# Patient Record
Sex: Male | Born: 1950 | Race: White | Hispanic: No | Marital: Married | Smoking: Never smoker
Health system: Southern US, Community
[De-identification: ages and names within clinical notes are randomized; demographics above are authoritative.]

## PROBLEM LIST (undated history)

## (undated) DIAGNOSIS — Z5189 Encounter for other specified aftercare: Secondary | ICD-10-CM

## (undated) DIAGNOSIS — I10 Essential (primary) hypertension: Secondary | ICD-10-CM

## (undated) DIAGNOSIS — C9001 Multiple myeloma in remission: Principal | ICD-10-CM

## (undated) HISTORY — PX: MANDIBLE SURGERY: SHX707

## (undated) HISTORY — PX: COLONOSCOPY: SHX174

## (undated) HISTORY — DX: Multiple myeloma in remission: C90.01

## (undated) HISTORY — PX: OTHER SURGICAL HISTORY: SHX169

## (undated) HISTORY — PX: BACK SURGERY: SHX140

## (undated) HISTORY — DX: Essential (primary) hypertension: I10

## (undated) HISTORY — DX: Encounter for other specified aftercare: Z51.89

---

## 1998-05-30 ENCOUNTER — Ambulatory Visit (HOSPITAL_COMMUNITY): Admission: RE | Admit: 1998-05-30 | Discharge: 1998-05-30 | Payer: Self-pay | Admitting: Hematology and Oncology

## 1998-07-10 ENCOUNTER — Ambulatory Visit (HOSPITAL_COMMUNITY): Admission: RE | Admit: 1998-07-10 | Discharge: 1998-07-10 | Payer: Self-pay | Admitting: Oncology

## 1998-07-13 ENCOUNTER — Ambulatory Visit (HOSPITAL_COMMUNITY): Admission: RE | Admit: 1998-07-13 | Discharge: 1998-07-13 | Payer: Self-pay | Admitting: Oncology

## 1999-01-24 ENCOUNTER — Ambulatory Visit (HOSPITAL_COMMUNITY): Admission: RE | Admit: 1999-01-24 | Discharge: 1999-01-24 | Payer: Self-pay | Admitting: Oncology

## 1999-01-24 ENCOUNTER — Encounter: Payer: Self-pay | Admitting: Oncology

## 1999-04-30 ENCOUNTER — Encounter: Payer: Self-pay | Admitting: Neurosurgery

## 1999-04-30 ENCOUNTER — Ambulatory Visit (HOSPITAL_COMMUNITY): Admission: RE | Admit: 1999-04-30 | Discharge: 1999-04-30 | Payer: Self-pay | Admitting: Neurosurgery

## 2000-01-28 ENCOUNTER — Encounter: Admission: RE | Admit: 2000-01-28 | Discharge: 2000-01-28 | Payer: Self-pay | Admitting: Oncology

## 2000-01-28 ENCOUNTER — Encounter: Payer: Self-pay | Admitting: Oncology

## 2000-05-01 ENCOUNTER — Ambulatory Visit (HOSPITAL_COMMUNITY): Admission: RE | Admit: 2000-05-01 | Discharge: 2000-05-01 | Payer: Self-pay | Admitting: Neurosurgery

## 2000-05-01 ENCOUNTER — Encounter: Payer: Self-pay | Admitting: Neurosurgery

## 2000-07-21 ENCOUNTER — Ambulatory Visit (HOSPITAL_COMMUNITY): Admission: RE | Admit: 2000-07-21 | Discharge: 2000-07-21 | Payer: Self-pay | Admitting: Oncology

## 2000-07-21 ENCOUNTER — Encounter: Payer: Self-pay | Admitting: Oncology

## 2001-01-22 ENCOUNTER — Ambulatory Visit (HOSPITAL_COMMUNITY): Admission: RE | Admit: 2001-01-22 | Discharge: 2001-01-22 | Payer: Self-pay | Admitting: Oncology

## 2001-01-22 ENCOUNTER — Encounter: Payer: Self-pay | Admitting: Oncology

## 2001-07-28 ENCOUNTER — Ambulatory Visit (HOSPITAL_COMMUNITY): Admission: RE | Admit: 2001-07-28 | Discharge: 2001-07-28 | Payer: Self-pay | Admitting: Oncology

## 2001-07-28 ENCOUNTER — Encounter: Payer: Self-pay | Admitting: Oncology

## 2002-02-02 ENCOUNTER — Ambulatory Visit (HOSPITAL_COMMUNITY): Admission: RE | Admit: 2002-02-02 | Discharge: 2002-02-02 | Payer: Self-pay | Admitting: Oncology

## 2002-02-02 ENCOUNTER — Encounter: Payer: Self-pay | Admitting: Oncology

## 2002-08-02 ENCOUNTER — Ambulatory Visit (HOSPITAL_COMMUNITY): Admission: RE | Admit: 2002-08-02 | Discharge: 2002-08-02 | Payer: Self-pay | Admitting: Oncology

## 2002-08-02 ENCOUNTER — Encounter: Payer: Self-pay | Admitting: Oncology

## 2002-09-07 ENCOUNTER — Ambulatory Visit (HOSPITAL_COMMUNITY): Admission: RE | Admit: 2002-09-07 | Discharge: 2002-09-07 | Payer: Self-pay | Admitting: Oncology

## 2002-09-07 ENCOUNTER — Encounter: Payer: Self-pay | Admitting: Oncology

## 2002-10-26 ENCOUNTER — Encounter: Payer: Self-pay | Admitting: Neurosurgery

## 2002-10-26 ENCOUNTER — Ambulatory Visit: Admission: RE | Admit: 2002-10-26 | Discharge: 2002-10-26 | Payer: Self-pay | Admitting: Neurosurgery

## 2003-01-21 ENCOUNTER — Ambulatory Visit (HOSPITAL_COMMUNITY): Admission: RE | Admit: 2003-01-21 | Discharge: 2003-01-21 | Payer: Self-pay | Admitting: Oncology

## 2003-01-21 ENCOUNTER — Encounter: Payer: Self-pay | Admitting: Oncology

## 2003-07-12 ENCOUNTER — Encounter: Payer: Self-pay | Admitting: Oncology

## 2003-07-12 ENCOUNTER — Ambulatory Visit (HOSPITAL_COMMUNITY): Admission: RE | Admit: 2003-07-12 | Discharge: 2003-07-12 | Payer: Self-pay | Admitting: Oncology

## 2004-01-02 ENCOUNTER — Ambulatory Visit (HOSPITAL_COMMUNITY): Admission: RE | Admit: 2004-01-02 | Discharge: 2004-01-02 | Payer: Self-pay | Admitting: Oncology

## 2004-07-23 ENCOUNTER — Ambulatory Visit (HOSPITAL_COMMUNITY): Admission: RE | Admit: 2004-07-23 | Discharge: 2004-07-23 | Payer: Self-pay | Admitting: Oncology

## 2004-10-15 ENCOUNTER — Ambulatory Visit: Payer: Self-pay | Admitting: Oncology

## 2005-01-28 ENCOUNTER — Ambulatory Visit: Payer: Self-pay | Admitting: Oncology

## 2005-01-30 ENCOUNTER — Ambulatory Visit (HOSPITAL_COMMUNITY): Admission: RE | Admit: 2005-01-30 | Discharge: 2005-01-30 | Payer: Self-pay | Admitting: Oncology

## 2005-04-19 ENCOUNTER — Ambulatory Visit: Payer: Self-pay | Admitting: Oncology

## 2005-07-19 ENCOUNTER — Ambulatory Visit: Payer: Self-pay | Admitting: Oncology

## 2005-10-25 ENCOUNTER — Ambulatory Visit: Payer: Self-pay | Admitting: Oncology

## 2006-01-17 ENCOUNTER — Ambulatory Visit: Payer: Self-pay | Admitting: Oncology

## 2006-01-22 ENCOUNTER — Ambulatory Visit (HOSPITAL_COMMUNITY): Admission: RE | Admit: 2006-01-22 | Discharge: 2006-01-22 | Payer: Self-pay | Admitting: Oncology

## 2006-04-24 ENCOUNTER — Ambulatory Visit: Payer: Self-pay | Admitting: Oncology

## 2006-04-28 LAB — CBC WITH DIFFERENTIAL/PLATELET
Basophils Absolute: 0 10*3/uL (ref 0.0–0.1)
EOS%: 1.5 % (ref 0.0–7.0)
Eosinophils Absolute: 0.1 10*3/uL (ref 0.0–0.5)
HGB: 13.8 g/dL (ref 13.0–17.1)
NEUT#: 4.8 10*3/uL (ref 1.5–6.5)
RDW: 13.3 % (ref 11.2–14.6)
lymph#: 1.7 10*3/uL (ref 0.9–3.3)

## 2006-04-28 LAB — MORPHOLOGY: PLT EST: ADEQUATE

## 2006-04-30 LAB — COMPREHENSIVE METABOLIC PANEL
AST: 33 U/L (ref 0–37)
Albumin: 4.4 g/dL (ref 3.5–5.2)
Alkaline Phosphatase: 57 U/L (ref 39–117)
BUN: 13 mg/dL (ref 6–23)
Potassium: 3.9 mEq/L (ref 3.5–5.3)
Sodium: 132 mEq/L — ABNORMAL LOW (ref 135–145)

## 2006-04-30 LAB — KAPPA/LAMBDA LIGHT CHAINS
Kappa free light chain: 1.43 mg/dL (ref 0.33–1.94)
Kappa:Lambda Ratio: 1.91 — ABNORMAL HIGH (ref 0.26–1.65)

## 2006-07-17 ENCOUNTER — Ambulatory Visit: Payer: Self-pay | Admitting: Oncology

## 2006-07-21 ENCOUNTER — Ambulatory Visit (HOSPITAL_COMMUNITY): Admission: RE | Admit: 2006-07-21 | Discharge: 2006-07-21 | Payer: Self-pay | Admitting: Oncology

## 2006-07-21 LAB — CBC WITH DIFFERENTIAL/PLATELET
BASO%: 0.1 % (ref 0.0–2.0)
Basophils Absolute: 0 10*3/uL (ref 0.0–0.1)
EOS%: 1 % (ref 0.0–7.0)
HCT: 39.1 % (ref 38.7–49.9)
HGB: 13.9 g/dL (ref 13.0–17.1)
LYMPH%: 21.1 % (ref 14.0–48.0)
MONO#: 0.4 10*3/uL (ref 0.1–0.9)
MONO%: 5.6 % (ref 0.0–13.0)
NEUT#: 5 10*3/uL (ref 1.5–6.5)
NEUT%: 72.2 % (ref 40.0–75.0)
WBC: 6.9 10*3/uL (ref 4.0–10.0)
lymph#: 1.5 10*3/uL (ref 0.9–3.3)

## 2006-07-24 LAB — IMMUNOFIXATION ELECTROPHORESIS: Total Protein, Serum Electrophoresis: 6.5 g/dL (ref 6.0–8.3)

## 2006-07-24 LAB — COMPREHENSIVE METABOLIC PANEL
Albumin: 4.5 g/dL (ref 3.5–5.2)
CO2: 27 mEq/L (ref 19–32)
Glucose, Bld: 106 mg/dL — ABNORMAL HIGH (ref 70–99)
Potassium: 3.9 mEq/L (ref 3.5–5.3)
Sodium: 136 mEq/L (ref 135–145)
Total Bilirubin: 0.8 mg/dL (ref 0.3–1.2)
Total Protein: 6.5 g/dL (ref 6.0–8.3)

## 2006-07-24 LAB — LACTATE DEHYDROGENASE: LDH: 200 U/L (ref 94–250)

## 2006-07-24 LAB — KAPPA/LAMBDA LIGHT CHAINS: Kappa:Lambda Ratio: 1.99 — ABNORMAL HIGH (ref 0.26–1.65)

## 2006-07-24 LAB — BETA 2 MICROGLOBULIN, SERUM: Beta-2 Microglobulin: 1.16 mg/L (ref 1.01–1.73)

## 2007-01-14 ENCOUNTER — Ambulatory Visit: Payer: Self-pay | Admitting: Oncology

## 2007-01-19 LAB — MORPHOLOGY
PLT EST: ADEQUATE
RBC Comments: NORMAL

## 2007-01-19 LAB — CBC WITH DIFFERENTIAL/PLATELET
BASO%: 0.3 % (ref 0.0–2.0)
MCHC: 36 g/dL — ABNORMAL HIGH (ref 32.0–35.9)
MONO#: 0.5 10*3/uL (ref 0.1–0.9)
RBC: 4.52 10*6/uL (ref 4.20–5.71)
WBC: 6.7 10*3/uL (ref 4.0–10.0)
lymph#: 1.5 10*3/uL (ref 0.9–3.3)

## 2007-01-21 LAB — COMPREHENSIVE METABOLIC PANEL
ALT: 27 U/L (ref 0–53)
Alkaline Phosphatase: 58 U/L (ref 39–117)
Sodium: 139 mEq/L (ref 135–145)
Total Bilirubin: 0.6 mg/dL (ref 0.3–1.2)
Total Protein: 6.9 g/dL (ref 6.0–8.3)

## 2007-01-21 LAB — IMMUNOFIXATION ELECTROPHORESIS
IgA: 196 mg/dL (ref 68–378)
IgG (Immunoglobin G), Serum: 1030 mg/dL (ref 694–1618)
IgM, Serum: 87 mg/dL (ref 60–263)
Total Protein, Serum Electrophoresis: 6.9 g/dL (ref 6.0–8.3)

## 2007-01-21 LAB — KAPPA/LAMBDA LIGHT CHAINS: Lambda Free Lght Chn: 0.76 mg/dL (ref 0.57–2.63)

## 2007-07-23 ENCOUNTER — Ambulatory Visit: Payer: Self-pay | Admitting: Oncology

## 2007-07-27 ENCOUNTER — Ambulatory Visit (HOSPITAL_COMMUNITY): Admission: RE | Admit: 2007-07-27 | Discharge: 2007-07-27 | Payer: Self-pay | Admitting: Oncology

## 2007-07-27 LAB — CBC WITH DIFFERENTIAL/PLATELET
Basophils Absolute: 0 10*3/uL (ref 0.0–0.1)
EOS%: 1.3 % (ref 0.0–7.0)
Eosinophils Absolute: 0.1 10*3/uL (ref 0.0–0.5)
HCT: 38.3 % — ABNORMAL LOW (ref 38.7–49.9)
HGB: 13.7 g/dL (ref 13.0–17.1)
LYMPH%: 28 % (ref 14.0–48.0)
MCH: 32 pg (ref 28.0–33.4)
MCV: 89.5 fL (ref 81.6–98.0)
MONO%: 4.6 % (ref 0.0–13.0)
NEUT#: 3.9 10*3/uL (ref 1.5–6.5)
NEUT%: 65.7 % (ref 40.0–75.0)
Platelets: 168 10*3/uL (ref 145–400)

## 2007-07-29 LAB — COMPREHENSIVE METABOLIC PANEL
AST: 27 U/L (ref 0–37)
Albumin: 4.4 g/dL (ref 3.5–5.2)
Alkaline Phosphatase: 54 U/L (ref 39–117)
BUN: 12 mg/dL (ref 6–23)
Creatinine, Ser: 0.91 mg/dL (ref 0.40–1.50)
Glucose, Bld: 135 mg/dL — ABNORMAL HIGH (ref 70–99)

## 2007-07-29 LAB — IMMUNOFIXATION ELECTROPHORESIS
IgG (Immunoglobin G), Serum: 964 mg/dL (ref 694–1618)
IgM, Serum: 86 mg/dL (ref 60–263)
Total Protein, Serum Electrophoresis: 6.8 g/dL (ref 6.0–8.3)

## 2007-07-29 LAB — KAPPA/LAMBDA LIGHT CHAINS
Kappa:Lambda Ratio: 0.88 (ref 0.26–1.65)
Lambda Free Lght Chn: 0.94 mg/dL (ref 0.57–2.63)

## 2007-09-09 ENCOUNTER — Ambulatory Visit: Payer: Self-pay | Admitting: Gastroenterology

## 2007-10-09 ENCOUNTER — Ambulatory Visit: Payer: Self-pay | Admitting: Gastroenterology

## 2007-10-09 ENCOUNTER — Encounter: Payer: Self-pay | Admitting: Gastroenterology

## 2007-10-23 ENCOUNTER — Ambulatory Visit: Payer: Self-pay | Admitting: Oncology

## 2007-10-27 LAB — CBC WITH DIFFERENTIAL/PLATELET
Basophils Absolute: 0 10*3/uL (ref 0.0–0.1)
Eosinophils Absolute: 0.1 10*3/uL (ref 0.0–0.5)
HCT: 37.9 % — ABNORMAL LOW (ref 38.7–49.9)
HGB: 13.7 g/dL (ref 13.0–17.1)
MCH: 32.4 pg (ref 28.0–33.4)
NEUT#: 3.6 10*3/uL (ref 1.5–6.5)
NEUT%: 62.3 % (ref 40.0–75.0)
RDW: 13.1 % (ref 11.2–14.6)
lymph#: 1.6 10*3/uL (ref 0.9–3.3)

## 2007-10-27 LAB — MORPHOLOGY: PLT EST: ADEQUATE

## 2007-10-29 LAB — IMMUNOFIXATION ELECTROPHORESIS
IgG (Immunoglobin G), Serum: 1040 mg/dL (ref 694–1618)
IgM, Serum: 99 mg/dL (ref 60–263)
Total Protein, Serum Electrophoresis: 6.6 g/dL (ref 6.0–8.3)

## 2007-10-29 LAB — COMPREHENSIVE METABOLIC PANEL
BUN: 14 mg/dL (ref 6–23)
CO2: 27 mEq/L (ref 19–32)
Glucose, Bld: 78 mg/dL (ref 70–99)
Sodium: 137 mEq/L (ref 135–145)
Total Bilirubin: 0.8 mg/dL (ref 0.3–1.2)
Total Protein: 6.6 g/dL (ref 6.0–8.3)

## 2007-10-29 LAB — LACTATE DEHYDROGENASE: LDH: 177 U/L (ref 94–250)

## 2007-10-29 LAB — KAPPA/LAMBDA LIGHT CHAINS
Kappa:Lambda Ratio: 0.74 (ref 0.26–1.65)
Lambda Free Lght Chn: 1.12 mg/dL (ref 0.57–2.63)

## 2008-01-14 ENCOUNTER — Ambulatory Visit: Payer: Self-pay | Admitting: Oncology

## 2008-01-18 LAB — CBC WITH DIFFERENTIAL/PLATELET
BASO%: 0.5 % (ref 0.0–2.0)
EOS%: 2.6 % (ref 0.0–7.0)
HCT: 37.8 % — ABNORMAL LOW (ref 38.7–49.9)
LYMPH%: 30.4 % (ref 14.0–48.0)
MCH: 31.6 pg (ref 28.0–33.4)
MCHC: 35.4 g/dL (ref 32.0–35.9)
NEUT%: 60.1 % (ref 40.0–75.0)
Platelets: 160 10*3/uL (ref 145–400)
RBC: 4.23 10*6/uL (ref 4.20–5.71)
lymph#: 1.6 10*3/uL (ref 0.9–3.3)

## 2008-01-18 LAB — MORPHOLOGY: RBC Comments: NORMAL

## 2008-01-20 LAB — COMPREHENSIVE METABOLIC PANEL
Albumin: 4.4 g/dL (ref 3.5–5.2)
Alkaline Phosphatase: 52 U/L (ref 39–117)
BUN: 16 mg/dL (ref 6–23)
Calcium: 9 mg/dL (ref 8.4–10.5)
Chloride: 101 mEq/L (ref 96–112)
Glucose, Bld: 83 mg/dL (ref 70–99)
Potassium: 4.1 mEq/L (ref 3.5–5.3)

## 2008-01-20 LAB — IMMUNOFIXATION ELECTROPHORESIS
IgG (Immunoglobin G), Serum: 937 mg/dL (ref 694–1618)
Total Protein, Serum Electrophoresis: 6.6 g/dL (ref 6.0–8.3)

## 2008-01-20 LAB — KAPPA/LAMBDA LIGHT CHAINS: Kappa:Lambda Ratio: 2.24 — ABNORMAL HIGH (ref 0.26–1.65)

## 2008-06-29 ENCOUNTER — Ambulatory Visit: Payer: Self-pay | Admitting: Oncology

## 2008-07-04 ENCOUNTER — Ambulatory Visit (HOSPITAL_COMMUNITY): Admission: RE | Admit: 2008-07-04 | Discharge: 2008-07-04 | Payer: Self-pay | Admitting: Oncology

## 2008-07-04 LAB — CBC WITH DIFFERENTIAL/PLATELET
BASO%: 0.7 % (ref 0.0–2.0)
Basophils Absolute: 0.1 10*3/uL (ref 0.0–0.1)
Eosinophils Absolute: 0.2 10*3/uL (ref 0.0–0.5)
HCT: 39.3 % (ref 38.7–49.9)
HGB: 14.4 g/dL (ref 13.0–17.1)
LYMPH%: 26.7 % (ref 14.0–48.0)
MCHC: 36.6 g/dL — ABNORMAL HIGH (ref 32.0–35.9)
MONO#: 0.4 10*3/uL (ref 0.1–0.9)
NEUT%: 63.6 % (ref 40.0–75.0)
Platelets: 175 10*3/uL (ref 145–400)
WBC: 7.4 10*3/uL (ref 4.0–10.0)
lymph#: 2 10*3/uL (ref 0.9–3.3)

## 2008-07-04 LAB — MORPHOLOGY: PLT EST: ADEQUATE

## 2008-07-06 LAB — KAPPA/LAMBDA LIGHT CHAINS
Kappa free light chain: 1.06 mg/dL (ref 0.33–1.94)
Lambda Free Lght Chn: 1.25 mg/dL (ref 0.57–2.63)

## 2008-07-06 LAB — COMPREHENSIVE METABOLIC PANEL
ALT: 37 U/L (ref 0–53)
Albumin: 4.5 g/dL (ref 3.5–5.2)
CO2: 26 mEq/L (ref 19–32)
Chloride: 99 mEq/L (ref 96–112)
Glucose, Bld: 95 mg/dL (ref 70–99)
Potassium: 4.2 mEq/L (ref 3.5–5.3)
Sodium: 134 mEq/L — ABNORMAL LOW (ref 135–145)
Total Protein: 6.9 g/dL (ref 6.0–8.3)

## 2008-07-06 LAB — IMMUNOFIXATION ELECTROPHORESIS: IgG (Immunoglobin G), Serum: 1050 mg/dL (ref 694–1618)

## 2008-07-06 LAB — LACTATE DEHYDROGENASE: LDH: 172 U/L (ref 94–250)

## 2008-07-06 LAB — IGE: IgE (Immunoglobulin E), Serum: 248.4 IU/mL — ABNORMAL HIGH (ref 0.0–180.0)

## 2009-01-11 ENCOUNTER — Ambulatory Visit: Payer: Self-pay | Admitting: Oncology

## 2009-01-16 LAB — CBC WITH DIFFERENTIAL/PLATELET
BASO%: 0.3 % (ref 0.0–2.0)
EOS%: 2.6 % (ref 0.0–7.0)
HGB: 14.2 g/dL (ref 13.0–17.1)
MCH: 31.9 pg (ref 28.0–33.4)
MCHC: 35.4 g/dL (ref 32.0–35.9)
RBC: 4.44 10*6/uL (ref 4.20–5.71)
RDW: 12.8 % (ref 11.2–14.6)
lymph#: 1.7 10*3/uL (ref 0.9–3.3)

## 2009-01-16 LAB — MORPHOLOGY: PLT EST: ADEQUATE

## 2009-01-17 LAB — COMPREHENSIVE METABOLIC PANEL
ALT: 28 U/L (ref 0–53)
AST: 26 U/L (ref 0–37)
Creatinine, Ser: 1 mg/dL (ref 0.40–1.50)
Total Bilirubin: 0.5 mg/dL (ref 0.3–1.2)

## 2009-01-17 LAB — LACTATE DEHYDROGENASE: LDH: 150 U/L (ref 94–250)

## 2009-07-12 ENCOUNTER — Ambulatory Visit: Payer: Self-pay | Admitting: Oncology

## 2009-07-14 LAB — COMPREHENSIVE METABOLIC PANEL
ALT: 26 U/L (ref 0–53)
AST: 25 U/L (ref 0–37)
Alkaline Phosphatase: 54 U/L (ref 39–117)
Creatinine, Ser: 0.91 mg/dL (ref 0.40–1.50)
Total Bilirubin: 0.8 mg/dL (ref 0.3–1.2)

## 2009-07-14 LAB — CBC WITH DIFFERENTIAL/PLATELET
BASO%: 0.3 % (ref 0.0–2.0)
EOS%: 1.6 % (ref 0.0–7.0)
MCH: 31.8 pg (ref 27.2–33.4)
MCHC: 34.7 g/dL (ref 32.0–36.0)
RDW: 13.1 % (ref 11.0–14.6)
WBC: 5.4 10*3/uL (ref 4.0–10.3)
lymph#: 1.5 10*3/uL (ref 0.9–3.3)

## 2009-07-14 LAB — KAPPA/LAMBDA LIGHT CHAINS
Kappa free light chain: 1.04 mg/dL (ref 0.33–1.94)
Kappa:Lambda Ratio: 1.21 (ref 0.26–1.65)
Lambda Free Lght Chn: 0.86 mg/dL (ref 0.57–2.63)

## 2009-07-14 LAB — LACTATE DEHYDROGENASE: LDH: 168 U/L (ref 94–250)

## 2009-07-14 LAB — MORPHOLOGY
PLT EST: ADEQUATE
RBC Comments: NORMAL

## 2009-07-17 ENCOUNTER — Ambulatory Visit (HOSPITAL_COMMUNITY): Admission: RE | Admit: 2009-07-17 | Discharge: 2009-07-17 | Payer: Self-pay | Admitting: Oncology

## 2010-01-12 ENCOUNTER — Ambulatory Visit: Payer: Self-pay | Admitting: Oncology

## 2010-01-16 LAB — CBC WITH DIFFERENTIAL/PLATELET
BASO%: 0.3 % (ref 0.0–2.0)
HCT: 39.7 % (ref 38.4–49.9)
LYMPH%: 25.2 % (ref 14.0–49.0)
MCHC: 34.9 g/dL (ref 32.0–36.0)
MCV: 91.3 fL (ref 79.3–98.0)
MONO#: 0.4 10*3/uL (ref 0.1–0.9)
NEUT%: 65.6 % (ref 39.0–75.0)
Platelets: 180 10*3/uL (ref 140–400)
WBC: 6.2 10*3/uL (ref 4.0–10.3)

## 2010-01-17 LAB — LACTATE DEHYDROGENASE: LDH: 146 U/L (ref 94–250)

## 2010-01-17 LAB — KAPPA/LAMBDA LIGHT CHAINS
Kappa free light chain: 1.51 mg/dL (ref 0.33–1.94)
Kappa:Lambda Ratio: 1.09 (ref 0.26–1.65)

## 2010-01-17 LAB — COMPREHENSIVE METABOLIC PANEL
ALT: 29 U/L (ref 0–53)
CO2: 25 mEq/L (ref 19–32)
Creatinine, Ser: 0.84 mg/dL (ref 0.40–1.50)
Glucose, Bld: 94 mg/dL (ref 70–99)
Total Bilirubin: 0.6 mg/dL (ref 0.3–1.2)

## 2010-07-30 ENCOUNTER — Ambulatory Visit (HOSPITAL_COMMUNITY): Admission: RE | Admit: 2010-07-30 | Discharge: 2010-07-30 | Payer: Self-pay | Admitting: Oncology

## 2010-07-30 ENCOUNTER — Ambulatory Visit (HOSPITAL_BASED_OUTPATIENT_CLINIC_OR_DEPARTMENT_OTHER): Payer: 59 | Admitting: Oncology

## 2010-07-30 LAB — LACTATE DEHYDROGENASE: LDH: 143 U/L (ref 94–250)

## 2010-07-30 LAB — CBC WITH DIFFERENTIAL/PLATELET
BASO%: 0.5 % (ref 0.0–2.0)
Eosinophils Absolute: 0.1 10*3/uL (ref 0.0–0.5)
MCHC: 34.2 g/dL (ref 32.0–36.0)
MONO#: 0.4 10*3/uL (ref 0.1–0.9)
NEUT#: 4.5 10*3/uL (ref 1.5–6.5)
RBC: 4.44 10*6/uL (ref 4.20–5.82)
WBC: 6.6 10*3/uL (ref 4.0–10.3)
lymph#: 1.5 10*3/uL (ref 0.9–3.3)

## 2010-07-30 LAB — COMPREHENSIVE METABOLIC PANEL
ALT: 34 U/L (ref 0–53)
Albumin: 3.9 g/dL (ref 3.5–5.2)
CO2: 28 mEq/L (ref 19–32)
Calcium: 9.1 mg/dL (ref 8.4–10.5)
Chloride: 100 mEq/L (ref 96–112)
Potassium: 4 mEq/L (ref 3.5–5.3)
Sodium: 135 mEq/L (ref 135–145)
Total Bilirubin: 0.8 mg/dL (ref 0.3–1.2)
Total Protein: 7.1 g/dL (ref 6.0–8.3)

## 2010-12-29 ENCOUNTER — Encounter: Payer: Self-pay | Admitting: Oncology

## 2010-12-30 ENCOUNTER — Encounter: Payer: Self-pay | Admitting: Oncology

## 2010-12-31 ENCOUNTER — Encounter: Payer: Self-pay | Admitting: Oncology

## 2011-01-28 ENCOUNTER — Encounter: Payer: 59 | Admitting: Oncology

## 2011-01-28 DIAGNOSIS — C9001 Multiple myeloma in remission: Secondary | ICD-10-CM

## 2011-01-28 LAB — CBC WITH DIFFERENTIAL/PLATELET
BASO%: 0.6 % (ref 0.0–2.0)
Basophils Absolute: 0 10*3/uL (ref 0.0–0.1)
EOS%: 2.4 % (ref 0.0–7.0)
Eosinophils Absolute: 0.2 10*3/uL (ref 0.0–0.5)
HCT: 39.1 % (ref 38.4–49.9)
HGB: 13.8 g/dL (ref 13.0–17.1)
LYMPH%: 26.6 % (ref 14.0–49.0)
MCH: 31.9 pg (ref 27.2–33.4)
MCHC: 35.2 g/dL (ref 32.0–36.0)
MCV: 90.6 fL (ref 79.3–98.0)
MONO#: 0.4 10*3/uL (ref 0.1–0.9)
MONO%: 6 % (ref 0.0–14.0)
NEUT#: 4.5 10*3/uL (ref 1.5–6.5)
NEUT%: 64.4 % (ref 39.0–75.0)
Platelets: 169 10*3/uL (ref 140–400)
RBC: 4.32 10*6/uL (ref 4.20–5.82)
RDW: 13.1 % (ref 11.0–14.6)
WBC: 6.9 10*3/uL (ref 4.0–10.3)
lymph#: 1.8 10*3/uL (ref 0.9–3.3)

## 2011-01-28 LAB — MORPHOLOGY
PLT EST: ADEQUATE
RBC Comments: NORMAL

## 2011-01-28 LAB — COMPREHENSIVE METABOLIC PANEL
ALT: 38 U/L (ref 0–53)
Alkaline Phosphatase: 51 U/L (ref 39–117)
CO2: 31 mEq/L (ref 19–32)
Creatinine, Ser: 1.02 mg/dL (ref 0.40–1.50)
Glucose, Bld: 79 mg/dL (ref 70–99)
Total Bilirubin: 0.8 mg/dL (ref 0.3–1.2)

## 2011-01-28 LAB — LACTATE DEHYDROGENASE: LDH: 151 U/L (ref 94–250)

## 2011-01-30 LAB — KAPPA/LAMBDA LIGHT CHAINS
Kappa free light chain: 1.12 mg/dL (ref 0.33–1.94)
Kappa:Lambda Ratio: 1.05 (ref 0.26–1.65)

## 2011-01-30 LAB — IMMUNOFIXATION ELECTROPHORESIS: Total Protein, Serum Electrophoresis: 6.8 g/dL (ref 6.0–8.3)

## 2011-07-31 ENCOUNTER — Other Ambulatory Visit: Payer: Self-pay | Admitting: Oncology

## 2011-07-31 ENCOUNTER — Encounter (HOSPITAL_BASED_OUTPATIENT_CLINIC_OR_DEPARTMENT_OTHER): Payer: 59 | Admitting: Oncology

## 2011-07-31 DIAGNOSIS — C9001 Multiple myeloma in remission: Secondary | ICD-10-CM

## 2011-07-31 LAB — CBC WITH DIFFERENTIAL/PLATELET
Basophils Absolute: 0 10*3/uL (ref 0.0–0.1)
EOS%: 1.3 % (ref 0.0–7.0)
HCT: 40.9 % (ref 38.4–49.9)
HGB: 14.4 g/dL (ref 13.0–17.1)
MCH: 32.2 pg (ref 27.2–33.4)
MCV: 91.7 fL (ref 79.3–98.0)
MONO%: 6.1 % (ref 0.0–14.0)
NEUT%: 68.6 % (ref 39.0–75.0)
Platelets: 179 10*3/uL (ref 140–400)

## 2011-08-02 LAB — IMMUNOFIXATION ELECTROPHORESIS: Total Protein, Serum Electrophoresis: 7.1 g/dL (ref 6.0–8.3)

## 2011-08-02 LAB — COMPREHENSIVE METABOLIC PANEL
Alkaline Phosphatase: 54 U/L (ref 39–117)
BUN: 14 mg/dL (ref 6–23)
Glucose, Bld: 89 mg/dL (ref 70–99)
Total Bilirubin: 0.7 mg/dL (ref 0.3–1.2)

## 2011-08-02 LAB — BETA 2 MICROGLOBULIN, SERUM: Beta-2 Microglobulin: 1.28 mg/L (ref 1.01–1.73)

## 2011-08-02 LAB — KAPPA/LAMBDA LIGHT CHAINS
Kappa:Lambda Ratio: 1.3 (ref 0.26–1.65)
Lambda Free Lght Chn: 1.32 mg/dL (ref 0.57–2.63)

## 2011-08-05 ENCOUNTER — Ambulatory Visit (HOSPITAL_COMMUNITY)
Admission: RE | Admit: 2011-08-05 | Discharge: 2011-08-05 | Disposition: A | Discharge status: Home / Self Care | Payer: 59 | Source: Ambulatory Visit | Attending: Oncology | Admitting: Oncology

## 2011-08-05 DIAGNOSIS — C9000 Multiple myeloma not having achieved remission: Secondary | ICD-10-CM | POA: Insufficient documentation

## 2011-08-06 ENCOUNTER — Encounter (HOSPITAL_BASED_OUTPATIENT_CLINIC_OR_DEPARTMENT_OTHER): Payer: 59 | Admitting: Oncology

## 2011-08-06 DIAGNOSIS — C9001 Multiple myeloma in remission: Secondary | ICD-10-CM

## 2012-01-18 ENCOUNTER — Telehealth: Payer: Self-pay | Admitting: Oncology

## 2012-01-18 NOTE — Telephone Encounter (Signed)
Talked to pt gave him appt for lab on 01/31/12, and August 2013 lab and MD

## 2012-01-31 ENCOUNTER — Other Ambulatory Visit: Payer: 59 | Admitting: Lab

## 2012-02-07 ENCOUNTER — Other Ambulatory Visit (HOSPITAL_BASED_OUTPATIENT_CLINIC_OR_DEPARTMENT_OTHER): Payer: 59 | Admitting: Lab

## 2012-02-07 DIAGNOSIS — C9001 Multiple myeloma in remission: Secondary | ICD-10-CM

## 2012-02-07 LAB — CBC WITH DIFFERENTIAL/PLATELET
BASO%: 0.4 % (ref 0.0–2.0)
Basophils Absolute: 0 10e3/uL (ref 0.0–0.1)
EOS%: 2.8 % (ref 0.0–7.0)
Eosinophils Absolute: 0.2 10e3/uL (ref 0.0–0.5)
HCT: 39.6 % (ref 38.4–49.9)
HGB: 13.6 g/dL (ref 13.0–17.1)
LYMPH%: 22.6 % (ref 14.0–49.0)
MCH: 31.5 pg (ref 27.2–33.4)
MCHC: 34.2 g/dL (ref 32.0–36.0)
MCV: 92 fL (ref 79.3–98.0)
MONO#: 0.5 10e3/uL (ref 0.1–0.9)
MONO%: 7.3 % (ref 0.0–14.0)
NEUT#: 4.2 10e3/uL (ref 1.5–6.5)
NEUT%: 66.9 % (ref 39.0–75.0)
Platelets: 184 10e3/uL (ref 140–400)
RBC: 4.3 10e6/uL (ref 4.20–5.82)
RDW: 13 % (ref 11.0–14.6)
WBC: 6.2 10e3/uL (ref 4.0–10.3)
lymph#: 1.4 10e3/uL (ref 0.9–3.3)

## 2012-02-07 LAB — BASIC METABOLIC PANEL WITH GFR
BUN: 22 mg/dL (ref 6–23)
CO2: 30 meq/L (ref 19–32)
Calcium: 9.3 mg/dL (ref 8.4–10.5)
Chloride: 99 meq/L (ref 96–112)
Creatinine, Ser: 1.02 mg/dL (ref 0.50–1.35)
Glucose, Bld: 95 mg/dL (ref 70–99)
Potassium: 4.1 meq/L (ref 3.5–5.3)
Sodium: 135 meq/L (ref 135–145)

## 2012-02-11 LAB — KAPPA/LAMBDA LIGHT CHAINS
Kappa free light chain: 1.55 mg/dL (ref 0.33–1.94)
Lambda Free Lght Chn: 1.17 mg/dL (ref 0.57–2.63)

## 2012-02-11 LAB — IMMUNOFIXATION ELECTROPHORESIS
IgG (Immunoglobin G), Serum: 959 mg/dL (ref 650–1600)
Total Protein, Serum Electrophoresis: 6.3 g/dL (ref 6.0–8.3)

## 2012-07-20 ENCOUNTER — Telehealth: Payer: Self-pay | Admitting: Oncology

## 2012-07-20 NOTE — Telephone Encounter (Signed)
Called pt, left message regarding appt , rescheduled from 8/27 due to MD call day, to 08/17/12

## 2012-07-28 ENCOUNTER — Other Ambulatory Visit: Payer: 59 | Admitting: Lab

## 2012-08-04 ENCOUNTER — Ambulatory Visit: Payer: 59 | Admitting: Oncology

## 2012-08-06 ENCOUNTER — Telehealth: Payer: Self-pay | Admitting: Oncology

## 2012-08-06 NOTE — Telephone Encounter (Signed)
lmonvm adviisng the pt if he could change his lab appt from 9/3 2013 to 08/07/2012. Was unable to reach the pt for an answer.

## 2012-08-07 ENCOUNTER — Ambulatory Visit (HOSPITAL_BASED_OUTPATIENT_CLINIC_OR_DEPARTMENT_OTHER): Payer: 59

## 2012-08-07 ENCOUNTER — Other Ambulatory Visit: Payer: Self-pay | Admitting: Oncology

## 2012-08-07 ENCOUNTER — Ambulatory Visit (HOSPITAL_COMMUNITY)
Admission: RE | Admit: 2012-08-07 | Discharge: 2012-08-07 | Disposition: A | Discharge status: Home / Self Care | Payer: 59 | Source: Ambulatory Visit | Attending: Oncology | Admitting: Oncology

## 2012-08-07 DIAGNOSIS — C9000 Multiple myeloma not having achieved remission: Secondary | ICD-10-CM | POA: Insufficient documentation

## 2012-08-07 DIAGNOSIS — M47817 Spondylosis without myelopathy or radiculopathy, lumbosacral region: Secondary | ICD-10-CM | POA: Insufficient documentation

## 2012-08-07 DIAGNOSIS — C9001 Multiple myeloma in remission: Secondary | ICD-10-CM

## 2012-08-07 DIAGNOSIS — M19019 Primary osteoarthritis, unspecified shoulder: Secondary | ICD-10-CM | POA: Insufficient documentation

## 2012-08-07 LAB — CBC WITH DIFFERENTIAL/PLATELET
BASO%: 0.2 % (ref 0.0–2.0)
Basophils Absolute: 0 10*3/uL (ref 0.0–0.1)
HCT: 41.4 % (ref 38.4–49.9)
HGB: 15.2 g/dL (ref 13.0–17.1)
MONO#: 0.7 10*3/uL (ref 0.1–0.9)
NEUT%: 69.4 % (ref 39.0–75.0)
WBC: 9.2 10*3/uL (ref 4.0–10.3)
lymph#: 2 10*3/uL (ref 0.9–3.3)

## 2012-08-07 LAB — COMPREHENSIVE METABOLIC PANEL (CC13)
BUN: 14 mg/dL (ref 7.0–26.0)
CO2: 27 mEq/L (ref 22–29)
Creatinine: 1.1 mg/dL (ref 0.7–1.3)
Glucose: 76 mg/dl (ref 70–99)
Total Bilirubin: 0.8 mg/dL (ref 0.20–1.20)

## 2012-08-07 LAB — LACTATE DEHYDROGENASE (CC13): LDH: 212 U/L (ref 125–220)

## 2012-08-11 ENCOUNTER — Other Ambulatory Visit: Payer: 59 | Admitting: Lab

## 2012-08-14 LAB — IMMUNOFIXATION ELECTROPHORESIS
IgG (Immunoglobin G), Serum: 1150 mg/dL (ref 650–1600)
IgM, Serum: 112 mg/dL (ref 41–251)
Total Protein, Serum Electrophoresis: 6.6 g/dL (ref 6.0–8.3)

## 2012-08-17 ENCOUNTER — Encounter: Payer: Self-pay | Admitting: Oncology

## 2012-08-17 ENCOUNTER — Ambulatory Visit (HOSPITAL_BASED_OUTPATIENT_CLINIC_OR_DEPARTMENT_OTHER): Payer: 59 | Admitting: Oncology

## 2012-08-17 ENCOUNTER — Telehealth: Payer: Self-pay | Admitting: Oncology

## 2012-08-17 VITALS — BP 141/79 | HR 87 | Temp 98.0°F | Resp 20 | Ht 74.0 in | Wt 191.5 lb

## 2012-08-17 DIAGNOSIS — C9001 Multiple myeloma in remission: Secondary | ICD-10-CM

## 2012-08-17 DIAGNOSIS — I10 Essential (primary) hypertension: Secondary | ICD-10-CM

## 2012-08-17 HISTORY — DX: Essential (primary) hypertension: I10

## 2012-08-17 HISTORY — DX: Multiple myeloma in remission: C90.01

## 2012-08-17 NOTE — Telephone Encounter (Signed)
Gave pt appt calendar for August 2014 lab and Bone Survey, pt will schedule appt himself, his wife works in Radiology Department, Pt will see MD on September 2014

## 2012-08-18 NOTE — Progress Notes (Signed)
Hematology and Oncology Follow Up Visit  Joshua Coleman [id number] 1951/08/23 61 y.o. 08/18/2012 8:49 AM   Principle Diagnosis: Encounter Diagnoses  Name Primary?  . Multiple myeloma in remission Yes  . HTN (hypertension), benign      Interim History:    Follow-up visit for this 61 year old man who initially presented with impending L-4 spinal cord compression due to a pathologic fracture of his lumbar spine in December 1997 as first sign of multiple myeloma.  He was treated with surgical decompression.  He never required radiation.  He was started on an aggressive chemotherapy program which he took for 1 year.  He was on a clinical trial and got randomized to a stem cell harvest after induction chemotherapy, but he did not receive an intensification and stem cell transplant.  He was randomized instead to prolonged chemotherapy with VCBP for 12 cycles.  He achieved a complete and durable response.   He is doing well. He has had no interim medical problems. No infections. Both of his children a son and a daughter have just started college.  Medications: reviewed  Allergies:  Allergies  Allergen Reactions  . Erythromycin Hives  . Penicillins Hives    Review of Systems: Constitutional:   No constitutional symptoms Respiratory: No cough or dyspnea Cardiovascular:  No chest pain or palpitations Gastrointestinal: No change in bowel habit Genito-Urinary: No urinary tract symptoms Musculoskeletal: No muscle or bone pain Neurologic: No headache or change in vision Skin: No rash or ecchymosis Remaining ROS negative.  Physical Exam: Blood pressure 141/79, pulse 87, temperature 98 F (36.7 C), temperature source Oral, resp. rate 20, height 6\' 2"  (1.88 m), weight 191 lb 8 oz (86.864 kg). Wt Readings from Last 3 Encounters:  08/17/12 191 lb 8 oz (86.864 kg)     General appearance: Well-nourished Caucasian man HENNT: Pharynx no erythema or exudate Lymph nodes: No  adenopathy Breasts: Lungs: Clear to auscultation resonant to percussion Heart: Regular rhythm no murmur Abdomen: Soft nontender no mass or organomegaly Extremities: No edema no calf tenderness Vascular: No cyanosis Neurologic: Weakness in extension of the left foot which is a chronic finding, motor strength otherwise 5 over 5 upper extremities and right lower extremity. Reflexes 1+ symmetric. Skin: No rash or ecchymosis  Lab Results: Lab Results  Component Value Date   WBC 9.2 08/07/2012   HGB 15.2 08/07/2012   HCT 41.4 08/07/2012   MCV 85.9 08/07/2012   PLT 164 08/07/2012     Chemistry      Component Value Date/Time   NA 131* 08/07/2012 1417   NA 135 02/07/2012 1628   K 3.8 08/07/2012 1417   K 4.1 02/07/2012 1628   CL 97* 08/07/2012 1417   CL 99 02/07/2012 1628   CO2 27 08/07/2012 1417   CO2 30 02/07/2012 1628   BUN 14.0 08/07/2012 1417   BUN 22 02/07/2012 1628   CREATININE 1.1 08/07/2012 1417   CREATININE 1.02 02/07/2012 1628      Component Value Date/Time   CALCIUM 9.0 08/07/2012 1417   CALCIUM 9.3 02/07/2012 1628   ALKPHOS 61 08/07/2012 1417   ALKPHOS 54 07/31/2011 1224   ALKPHOS 54 07/31/2011 1224   ALKPHOS 54 07/31/2011 1224   ALKPHOS 54 07/31/2011 1224   ALKPHOS 54 07/31/2011 1224   ALKPHOS 54 07/31/2011 1224   AST 40* 08/07/2012 1417   AST 28 07/31/2011 1224   AST 28 07/31/2011 1224   AST 28 07/31/2011 1224   AST 28 07/31/2011 1224  AST 28 07/31/2011 1224   AST 28 07/31/2011 1224   ALT 48 08/07/2012 1417   ALT 35 07/31/2011 1224   ALT 35 07/31/2011 1224   ALT 35 07/31/2011 1224   ALT 35 07/31/2011 1224   ALT 35 07/31/2011 1224   ALT 35 07/31/2011 1224   BILITOT 0.80 08/07/2012 1417   BILITOT 0.7 07/31/2011 1224   BILITOT 0.7 07/31/2011 1224   BILITOT 0.7 07/31/2011 1224   BILITOT 0.7 07/31/2011 1224   BILITOT 0.7 07/31/2011 1224   BILITOT 0.7 07/31/2011 1224       Radiological Studies: Dg Bone Survey Met  08/07/2012  *RADIOLOGY REPORT*  Clinical Data: Multiple myeloma.  Follow-up bone  survey.  METASTATIC BONE SURVEY  Comparison: 08/05/2011.  Findings: Normal appearance of the calvarium.  No osteolytic lesions.  Bilateral AC joint osteoarthritis.  Plate and screw fixation is present in the right humeral shaft with healed mid shaft fracture.  Thoracic spine appears within normal limits. Pelvic bones and hips appear normal.  Calcified phleboliths in the anatomic pelvis.  Normal appearance of the femur bilaterally. Postoperative changes from L3-L5 with strut graft and posterior rod and screw fixation.  This appears similar to prior exam.  Question prior partial corpectomy of L4.  Mild lumbar spondylosis with adjacent segment disease at L2-L3.  IMPRESSION: Stable exam.  No new lytic lesions or osseous abnormalities.   Original Report Authenticated By: Andreas Newport, M.D.     Impression and Plan: #1. Nonsecretory multiple myeloma in continuous remission now out almost 16 years from diagnosis and 15 years off all chemotherapy. I will continue every 6 month lab analysis, annual lab, bone x-rays, and physical exams.  #2. Essential hypertension  CC:. Dr. Montel Culver Avva   Levert Feinstein, MD 9/10/20138:49 AM

## 2012-09-11 ENCOUNTER — Encounter: Payer: Self-pay | Admitting: Gastroenterology

## 2012-11-25 ENCOUNTER — Encounter: Payer: Self-pay | Admitting: Gastroenterology

## 2012-11-26 ENCOUNTER — Other Ambulatory Visit: Payer: Self-pay | Admitting: Dermatology

## 2012-12-24 ENCOUNTER — Encounter: Payer: Self-pay | Admitting: Gastroenterology

## 2012-12-24 ENCOUNTER — Ambulatory Visit (AMBULATORY_SURGERY_CENTER): Payer: 59 | Admitting: *Deleted

## 2012-12-24 VITALS — Ht 71.0 in | Wt 190.0 lb

## 2012-12-24 DIAGNOSIS — Z1211 Encounter for screening for malignant neoplasm of colon: Secondary | ICD-10-CM

## 2012-12-24 DIAGNOSIS — Z86010 Personal history of colonic polyps: Secondary | ICD-10-CM

## 2012-12-24 MED ORDER — PEG-KCL-NACL-NASULF-NA ASC-C 100 G PO SOLR: ORAL | Status: DC

## 2013-01-05 ENCOUNTER — Ambulatory Visit (AMBULATORY_SURGERY_CENTER): Payer: 59 | Admitting: Gastroenterology

## 2013-01-05 ENCOUNTER — Encounter: Payer: Self-pay | Admitting: Gastroenterology

## 2013-01-05 VITALS — BP 125/71 | HR 70 | Temp 96.8°F | Resp 17 | Ht 71.0 in | Wt 190.0 lb

## 2013-01-05 DIAGNOSIS — Z860100 Personal history of colon polyps, unspecified: Secondary | ICD-10-CM

## 2013-01-05 DIAGNOSIS — D126 Benign neoplasm of colon, unspecified: Secondary | ICD-10-CM

## 2013-01-05 DIAGNOSIS — Z1211 Encounter for screening for malignant neoplasm of colon: Secondary | ICD-10-CM

## 2013-01-05 DIAGNOSIS — K5730 Diverticulosis of large intestine without perforation or abscess without bleeding: Secondary | ICD-10-CM

## 2013-01-05 MED ORDER — SODIUM CHLORIDE 0.9 % IV SOLN: 500.0000 mL | INTRAVENOUS | Status: DC

## 2013-01-05 NOTE — Progress Notes (Signed)
The pt tolerated the colonoscopy very well. Maw   

## 2013-01-05 NOTE — Patient Instructions (Addendum)
YOU HAD AN ENDOSCOPIC PROCEDURE TODAY AT THE [facility]: Refer to the procedure report that was given to you for any specific questions about what was found during the examination.  If the procedure report does not answer your questions, please call your gastroenterologist to clarify.  If you requested that your care partner not be given the details of your procedure findings, then the procedure report has been included in a sealed envelope for you to review at your convenience later.  YOU SHOULD EXPECT: Some feelings of bloating in the abdomen. Passage of more gas than usual.  Walking can help get rid of the air that was put into your GI tract during the procedure and reduce the bloating. If you had a lower endoscopy (such as a colonoscopy or flexible sigmoidoscopy) you may notice spotting of blood in your stool or on the toilet paper. If you underwent a bowel prep for your procedure, then you may not have a normal bowel movement for a few days.  DIET: Your first meal following the procedure should be a light meal and then it is ok to progress to your normal diet.  A half-sandwich or bowl of soup is an example of a good first meal.  Heavy or fried foods are harder to digest and may make you feel nauseous or bloated.  Likewise meals heavy in dairy and vegetables can cause extra gas to form and this can also increase the bloating.  Drink plenty of fluids but you should avoid alcoholic beverages for 24 hours.  ACTIVITY: Your care partner should take you home directly after the procedure.  You should plan to take it easy, moving slowly for the rest of the day.  You can resume normal activity the day after the procedure however you should NOT DRIVE or use heavy machinery for 24 hours (because of the sedation medicines used during the test).    SYMPTOMS TO REPORT IMMEDIATELY: A gastroenterologist can be reached at any hour.  During normal business hours, 8:30 AM to 5:00 PM Monday through Friday,  call [phone].  After hours and on weekends, please call the GI answering service at [phone] emergency number  who will take a message and have the physician on call contact you.   Following lower endoscopy (colonoscopy or flexible sigmoidoscopy):  Excessive amounts of blood in the stool  Significant tenderness or worsening of abdominal pains  Swelling of the abdomen that is new, acute  Fever of 100F or higher  FOLLOW UP: If any biopsies were taken you will be contacted by phone or by letter within the next 1-3 weeks.  Call your gastroenterologist if you have not heard about the biopsies in 3 weeks.  Our staff will call the home number listed on your records the next business day following your procedure to check on you and address any questions or concerns that you may have at that time regarding the information given to you following your procedure. This is a courtesy call and so if there is no answer at the home number and we have not heard from you through the emergency physician on call, we will assume that you have returned to your regular daily activities without incident.  SIGNATURES/CONFIDENTIALITY: You and/or your care partner have signed paperwork which will be entered into your electronic medical record.  These signatures attest to the fact that that the information above on your After Visit Summary has been reviewed and is understood.  Full responsibility of the   confidentiality of this discharge information lies with you and/or your care-partner.   Handout on polyps, diverticulosis high fiber diet Repeat colon 5 years

## 2013-01-05 NOTE — Progress Notes (Signed)
As Dr. Christella Hartigan was advancing the scope in the colon, the pt said he wanted to know what was going on.  I told him Dr. Christella Hartigan said he had diverticulosis.  I asked the pt if he was comfortable, he said, "yes, I am fine".  I asked him to let me know if he was uncomfortable, I could give him more sedation.  Pt said he would. Maw

## 2013-01-05 NOTE — Op Note (Signed)
[facility] [address] [place], [address]   COLONOSCOPY PROCEDURE REPORT  PATIENT: Joshua Coleman, Joshua Coleman  MR#: [id number] BIRTHDATE: 01-21-1951 , 61  yrs. old GENDER: Male ENDOSCOPIST: Rachael Fee, MD PROCEDURE DATE:  01/05/2013 PROCEDURE:   Colonoscopy with snare polypectomy ASA CLASS:   Class II INDICATIONS: two small adenomas removed 2008. MEDICATIONS: Fentanyl 50 mcg IV, Versed 6 mg IV, and These medications were titrated to patient response per physician's verbal order  DESCRIPTION OF PROCEDURE:   After the risks benefits and alternatives of the procedure were thoroughly explained, informed consent was obtained.  A digital rectal exam revealed no abnormalities of the rectum.   The Pentax Ped Colon [id number] endoscope was introduced through the anus and advanced to the cecum, which was identified by both the appendix and ileocecal valve. No adverse events experienced.   The quality of the prep was good, using MoviPrep  The instrument was then slowly withdrawn as the colon was fully examined.  COLON FINDINGS: One small polyp was found, removed but not retrieved.  This was 2mm across, sessile, located in descending segment, removed with cold snare.  There were a few small diverticulum in left colon.  The examination was otherwise normal. Retroflexed views revealed no abnormalities. The time to cecum=2 minutes 47 seconds.  Withdrawal time=7 minutes 58 seconds.  The scope was withdrawn and the procedure completed. COMPLICATIONS: There were no complications.  ENDOSCOPIC IMPRESSION: One small polyp was found, removed but not retrieved. There were a few small diverticulum in left colon. The examination was otherwise normal.  RECOMMENDATIONS: Given your personal history of adenomatous (pre-cancerous) polyps, you will need a repeat colonoscopy in 5 years.   eSigned:  Rachael Fee, MD 01/05/2013 10:24 AM   cc: Chilton Greathouse, MD

## 2013-01-05 NOTE — Progress Notes (Signed)
Patient did not experience any of the following events: a burn prior to discharge; a fall within the facility; wrong site/side/patient/procedure/implant event; or a hospital transfer or hospital admission upon discharge from the facility. (G8907)Patient did not have preoperative order for IV antibiotic SSI prophylaxis. (G8918) ewm 

## 2013-01-06 ENCOUNTER — Telehealth: Payer: Self-pay | Admitting: *Deleted

## 2013-01-06 NOTE — Telephone Encounter (Signed)
  Follow up Call-  Call back number 01/05/2013  Post procedure Call Back phone  # [phone]  Permission to leave phone message Yes     Patient questions:  Do you have a fever, pain , or abdominal swelling? no Pain Score  0 *  Have you tolerated food without any problems? yes  Have you been able to return to your normal activities? yes, at work per pt.  Do you have any questions about your discharge instructions: Diet   no Medications  no Follow up visit  no  Do you have questions or concerns about your Care? no  Actions: * If pain score is 4 or above: No action needed, pain <4.

## 2013-01-23 IMAGING — CR DG BONE SURVEY MET
10 series · 10 of 10 positions shown · non-contrast
Comparison: Bone survey 07/30/2010 and 07/17/2009

CLINICAL DATA: History multiple myeloma

METASTATIC BONE SURVEY

[w c-spine lat]
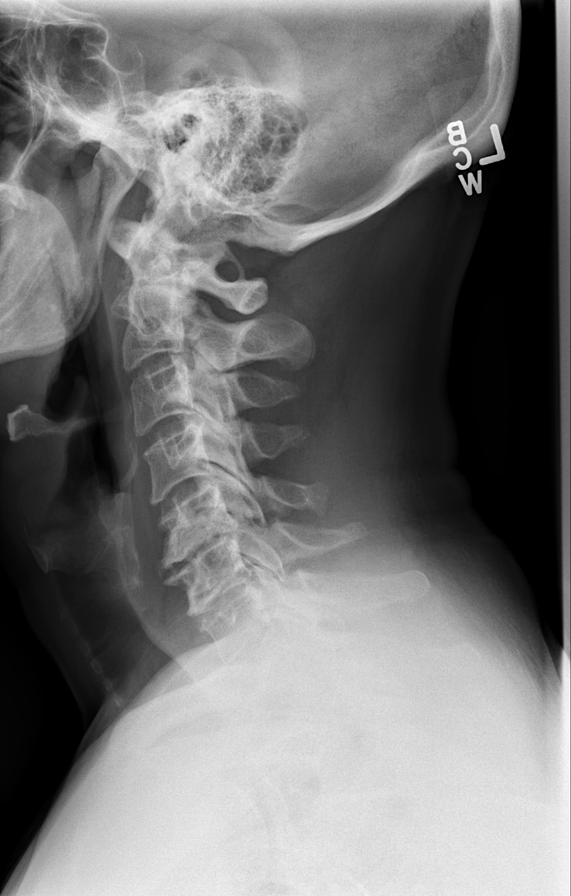

[w skull lat *]
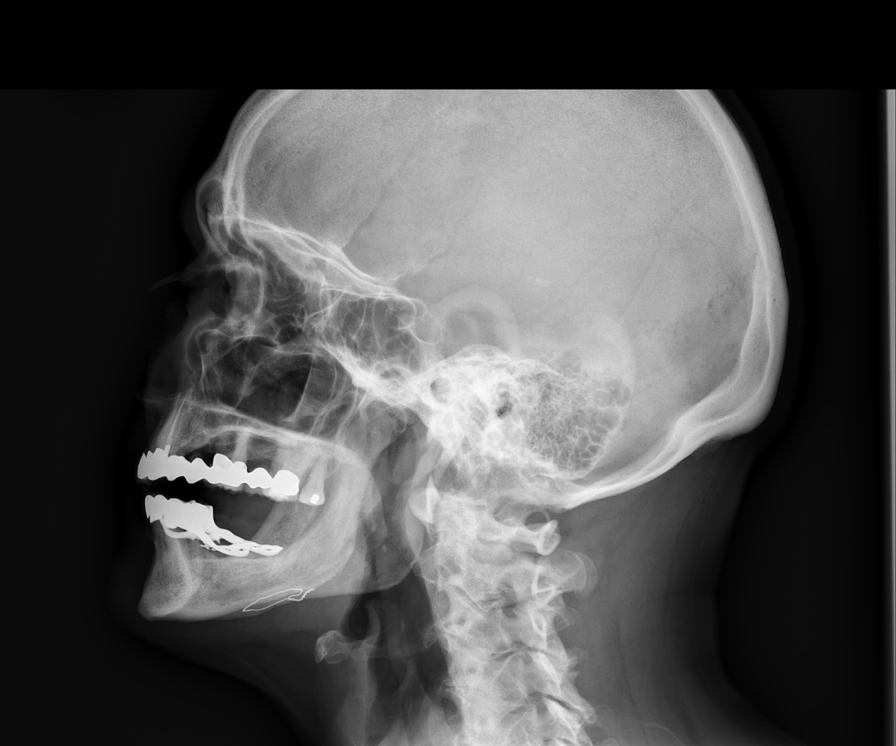

[w c-spine a.p. *]
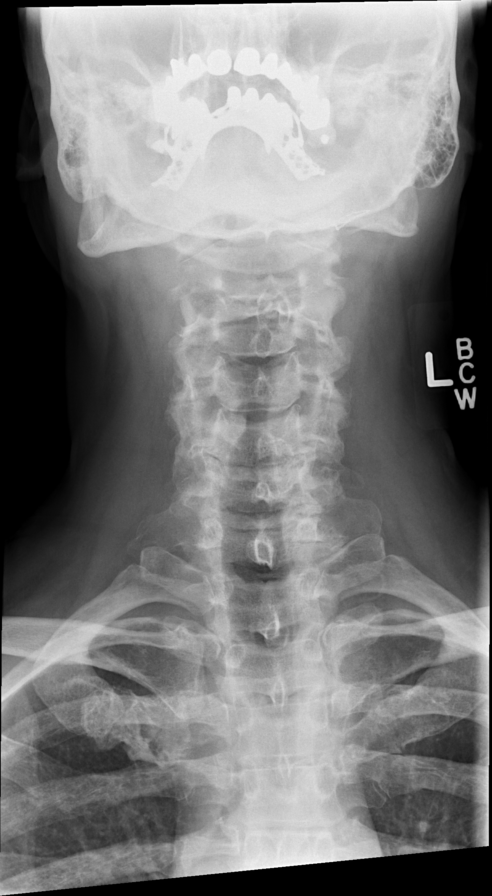

[w swimmers view *]
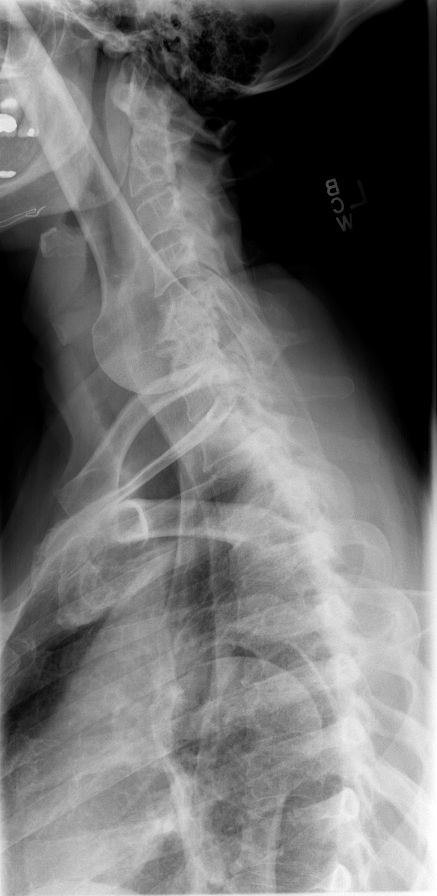

[w humerus ap left *]
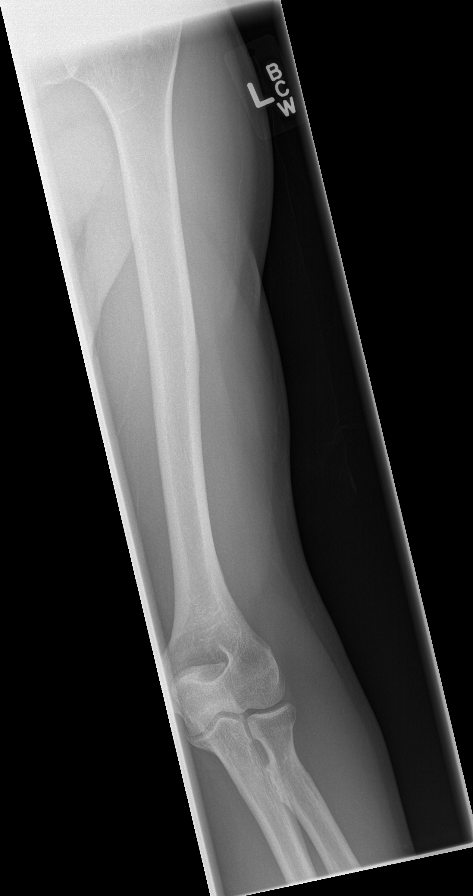

[w humerus ap right *]
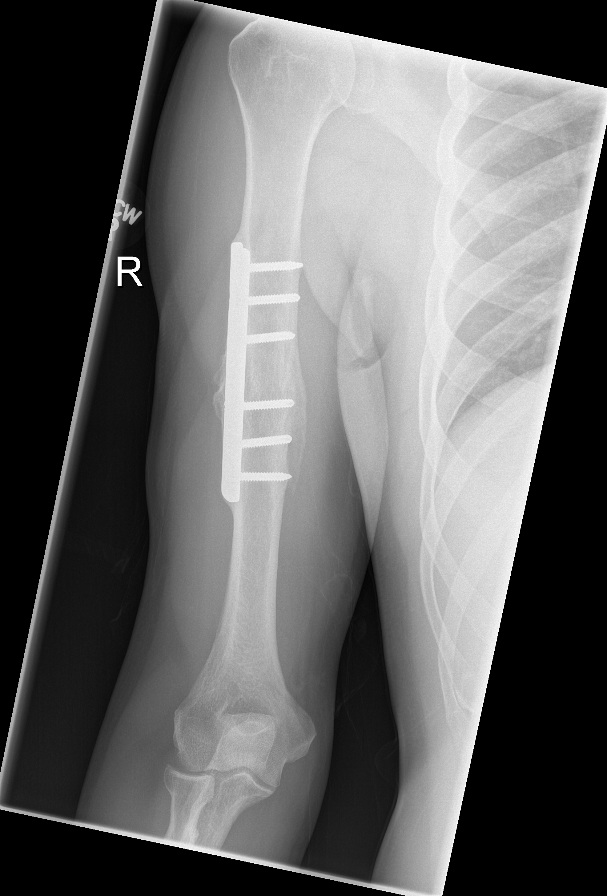

[w shoulder ap external right]
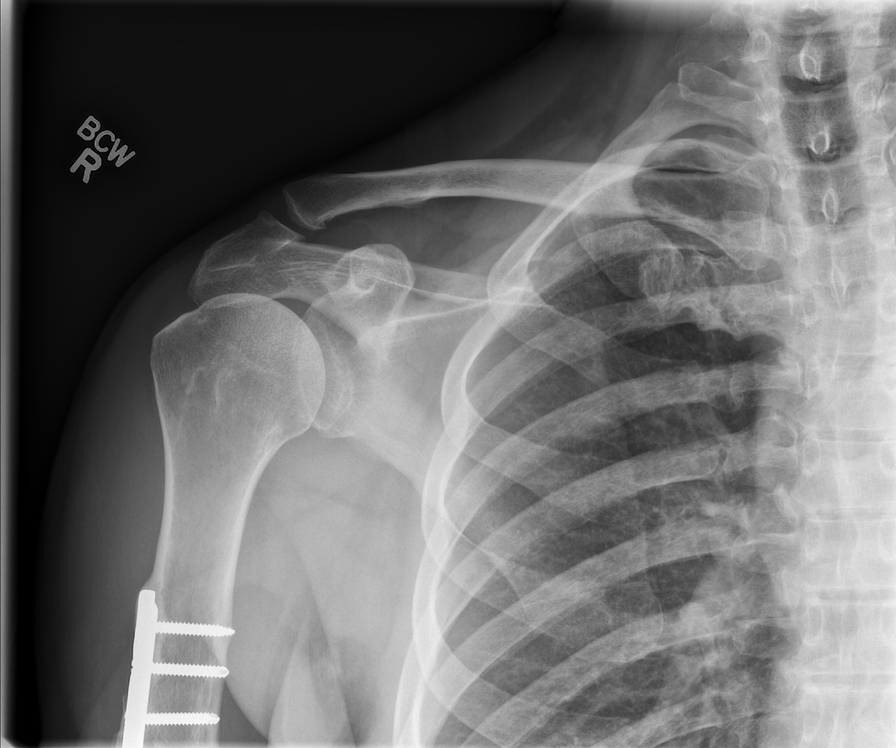

[w shoulder ap external left]
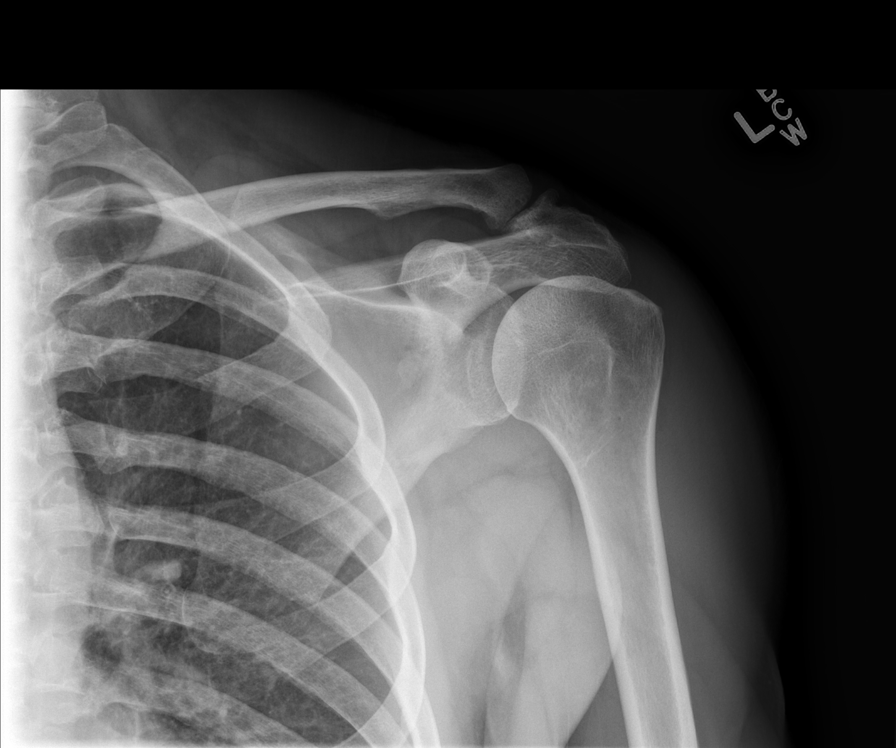

[t t-spine a.p.]
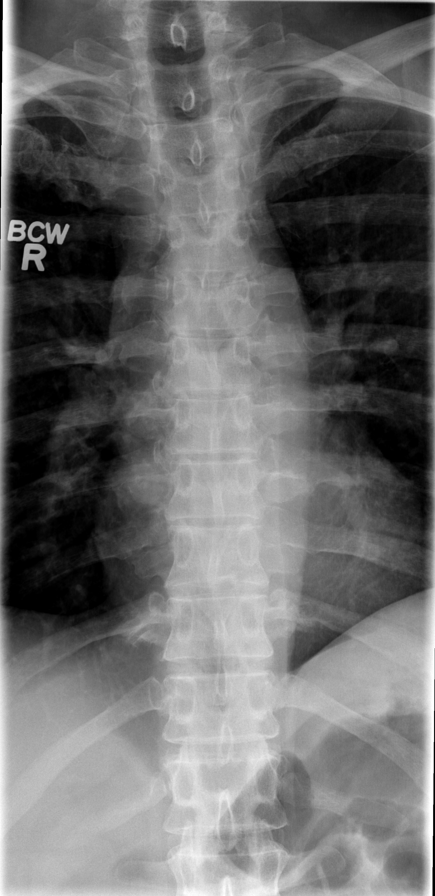

[t l-spine a.p.]
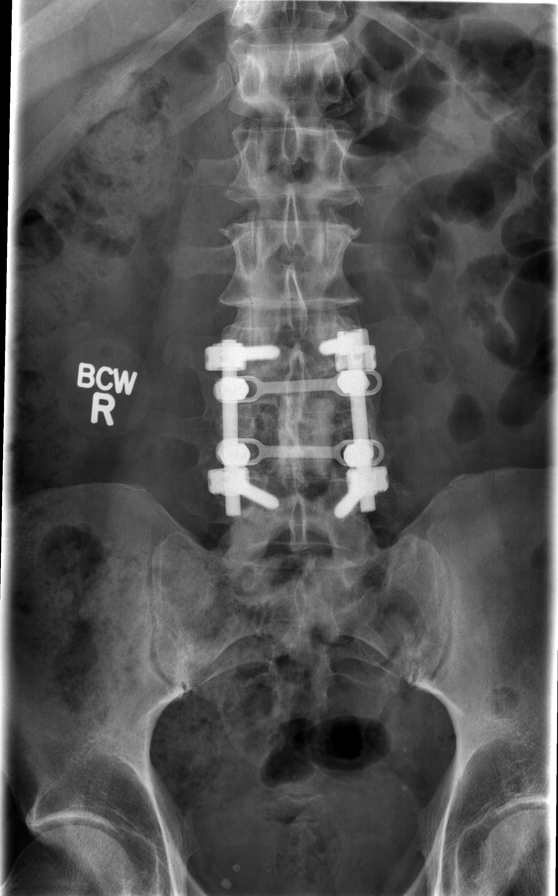

[10 of 10 positions shown; findings below may reference images not displayed]

FINDINGS: A total of 15 images of the skeleton are submitted.

Skull:  No focal lytic lesions.

Cervical spine:  Degenerative changes at C5-6 and C6-7.  Normal
prevertebral soft tissues.  No focal lytic lesions.

Thoracic spine:  Normal height of thoracic spine vertebral bodies.
No fracture or focal lytic lesion.

Lumbar spine:  Strut graft at L4 with posterior fusion from L3-L5
is stable.   Abnormal appearance of L4 vertebral body is stable.
No new abnormality of the lumbar spine.  No evidence of hardware
failure.

Shoulder and humeri.  Lateral cortical plate and screw fixation of
the mid right humerus with callus formation is stable. No acute
fractures or focal lytic lesions.

Pelvis:  Bowel gas projects over the sacrum and iliac bones.  No
focal lytic lesion or fracture is identified.

Femurs: No fracture or focal lytic lesions.
IMPRESSION: Stable exam.  No new lytic lesions or fractures.

## 2013-08-06 ENCOUNTER — Other Ambulatory Visit (HOSPITAL_BASED_OUTPATIENT_CLINIC_OR_DEPARTMENT_OTHER): Payer: 59

## 2013-08-06 DIAGNOSIS — C9001 Multiple myeloma in remission: Secondary | ICD-10-CM

## 2013-08-06 LAB — CBC WITH DIFFERENTIAL/PLATELET
Basophils Absolute: 0 10*3/uL (ref 0.0–0.1)
Eosinophils Absolute: 0.1 10*3/uL (ref 0.0–0.5)
HGB: 15.3 g/dL (ref 13.0–17.1)
MCV: 90.1 fL (ref 79.3–98.0)
MONO%: 8 % (ref 0.0–14.0)
NEUT#: 4.4 10*3/uL (ref 1.5–6.5)
RDW: 12.7 % (ref 11.0–14.6)

## 2013-08-06 LAB — COMPREHENSIVE METABOLIC PANEL (CC13)
ALT: 42 U/L (ref 0–55)
AST: 39 U/L — ABNORMAL HIGH (ref 5–34)
Calcium: 8.9 mg/dL (ref 8.4–10.4)
Chloride: 98 mEq/L (ref 98–109)
Creatinine: 0.8 mg/dL (ref 0.7–1.3)
Potassium: 4 mEq/L (ref 3.5–5.1)
Sodium: 130 mEq/L — ABNORMAL LOW (ref 136–145)

## 2013-08-13 ENCOUNTER — Encounter: Payer: Self-pay | Admitting: Oncology

## 2013-08-17 ENCOUNTER — Ambulatory Visit: Payer: 59 | Admitting: Oncology

## 2013-08-17 ENCOUNTER — Telehealth: Payer: Self-pay | Admitting: Oncology

## 2013-08-17 NOTE — Telephone Encounter (Signed)
Talked to patient and he wans to know if he needs to r/s appt to next year based to lab result, left message with nurse, waiting for answer then will contact patient

## 2013-08-18 ENCOUNTER — Encounter: Payer: Self-pay | Admitting: Oncology

## 2013-08-18 NOTE — Progress Notes (Signed)
Very compliant patient with multiple myeloma who was fortunate enough to have achieved a durable remission and is now out over 10 years from diagnosis. He failed to report for today's visit. He will be rescheduled.

## 2014-02-05 ENCOUNTER — Telehealth: Payer: Self-pay | Admitting: *Deleted

## 2014-02-05 NOTE — Telephone Encounter (Signed)
Former pt of DR. G...td  

## 2014-03-18 ENCOUNTER — Ambulatory Visit: Payer: 59 | Admitting: Oncology

## 2014-04-01 ENCOUNTER — Telehealth: Payer: Self-pay | Admitting: Oncology

## 2014-04-01 ENCOUNTER — Ambulatory Visit (HOSPITAL_BASED_OUTPATIENT_CLINIC_OR_DEPARTMENT_OTHER): Payer: 59

## 2014-04-01 ENCOUNTER — Ambulatory Visit (HOSPITAL_BASED_OUTPATIENT_CLINIC_OR_DEPARTMENT_OTHER): Payer: 59 | Admitting: Oncology

## 2014-04-01 ENCOUNTER — Encounter: Payer: Self-pay | Admitting: Oncology

## 2014-04-01 VITALS — BP 151/74 | HR 91 | Temp 97.5°F | Resp 18 | Ht 71.0 in | Wt 184.2 lb

## 2014-04-01 DIAGNOSIS — C9001 Multiple myeloma in remission: Secondary | ICD-10-CM

## 2014-04-01 LAB — COMPREHENSIVE METABOLIC PANEL
ALT: 40 U/L (ref 0–53)
AST: 46 U/L — AB (ref 0–37)
Albumin: 4 g/dL (ref 3.5–5.2)
Alkaline Phosphatase: 62 U/L (ref 39–117)
BUN: 20 mg/dL (ref 6–23)
CALCIUM: 9.6 mg/dL (ref 8.4–10.5)
CHLORIDE: 95 meq/L — AB (ref 96–112)
CO2: 28 mEq/L (ref 19–32)
Creatinine, Ser: 1.02 mg/dL (ref 0.50–1.35)
Glucose, Bld: 93 mg/dL (ref 70–99)
Potassium: 4.3 mEq/L (ref 3.5–5.3)
Sodium: 135 mEq/L (ref 135–145)
TOTAL PROTEIN: 7 g/dL (ref 6.0–8.3)
Total Bilirubin: 0.4 mg/dL (ref 0.3–1.2)

## 2014-04-01 LAB — CBC WITH DIFFERENTIAL/PLATELET
BASO%: 0.4 % (ref 0.0–2.0)
BASOS ABS: 0 10*3/uL (ref 0.0–0.1)
EOS%: 2.1 % (ref 0.0–7.0)
Eosinophils Absolute: 0.1 10*3/uL (ref 0.0–0.5)
HEMATOCRIT: 41.7 % (ref 38.4–49.9)
HEMOGLOBIN: 14.3 g/dL (ref 13.0–17.1)
LYMPH#: 1.9 10*3/uL (ref 0.9–3.3)
LYMPH%: 27.4 % (ref 14.0–49.0)
MCH: 31.6 pg (ref 27.2–33.4)
MCHC: 34.4 g/dL (ref 32.0–36.0)
MCV: 92 fL (ref 79.3–98.0)
MONO#: 0.5 10*3/uL (ref 0.1–0.9)
MONO%: 7.2 % (ref 0.0–14.0)
NEUT%: 62.9 % (ref 39.0–75.0)
NEUTROS ABS: 4.3 10*3/uL (ref 1.5–6.5)
Platelets: 171 10*3/uL (ref 140–400)
RBC: 4.53 10*6/uL (ref 4.20–5.82)
RDW: 13 % (ref 11.0–14.6)
WBC: 6.9 10*3/uL (ref 4.0–10.3)

## 2014-04-01 NOTE — Telephone Encounter (Signed)
gv adn pritned aptps ched and avs for pt for April 2016....sent pt to lab

## 2014-04-01 NOTE — Progress Notes (Signed)
Hematology and Oncology Follow Up Visit  Joshua Coleman [id number] November 10, 1951 63 y.o. 04/01/2014 3:48 PM   Principle Diagnosis: 63 year old with multiple myeloma diagnosed in 1997. He presented with impending L-4 spinal cord compression due to a pathologic fracture of his lumbar spine in December 1997 as first sign of multiple myeloma    Prior Therapy:  He was treated with surgical decompression. He never required radiation. He was started on an aggressive chemotherapy program which he took for 1 year. He was on a clinical trial and got randomized to a stem cell harvest after induction chemotherapy, but he did not receive an intensification and stem cell transplant. He was randomized instead to prolonged chemotherapy with VCBP for 12 cycles. He achieved a complete and durable response.    Current therapy: Observation and surveillance.  Interim History:  Joshua Coleman presents today for a followup visit. He is a gentleman with the above history treated previously by Dr. Beryle Beams. He continues to be in remission over 10 years out from the conclusion of his chemotherapy. He continues to do well without any residual complications. He does have very faint neuropathy and decreased proprioception but really no changes in the last years. Has not reported any pathological fractures. Does not report any constitutional symptoms. Has not reported any hospitalization or illnesses.  Medications: I have reviewed the patient's current medications.  Current Outpatient Prescriptions  Medication Sig Dispense Refill  . amLODipine (NORVASC) 5 MG tablet Take 5 mg by mouth daily.      . fish oil-omega-3 fatty acids 1000 MG capsule Take 1 g by mouth daily.      . Multiple Vitamin (MULTIVITAMIN) tablet Take 1 tablet by mouth daily.      Marland Kitchen testosterone (ANDROGEL) 50 MG/5GM GEL Place 5 g onto the skin daily.       No current facility-administered medications for this visit.     Allergies:  Allergies  Allergen  Reactions  . Erythromycin Hives  . Penicillins Hives    Past Medical History, Surgical history, Social history, and Family History were reviewed and updated.  Review of Systems: Constitutional:  Negative for fever, chills, night sweats, anorexia, weight loss, pain. Cardiovascular: no chest pain or dyspnea on exertion Respiratory: no cough, shortness of breath, or wheezing Neurological: no TIA or stroke symptoms Dermatological: negative ENT: negative Skin: Negative. Gastrointestinal: no abdominal pain, change in bowel habits, or black or bloody stools Genito-Urinary: negative for - dysuria or hematuria Hematological and Lymphatic: negative for - blood clots, bruising or weight loss Breast: negative Musculoskeletal: negative for - joint pain or muscular weakness Remaining ROS negative. Physical Exam: Blood pressure 151/74, pulse 91, temperature 97.5 F (36.4 C), temperature source Oral, resp. rate 18, height $RemoveBe'5\' 11"'avPhEsyst$  (1.803 m), weight 184 lb 3.2 oz (83.553 kg). ECOG:  0 General appearance: alert, cooperative and appears stated age Head: Normocephalic, without obvious abnormality, atraumatic Neck: no adenopathy, no carotid bruit, no JVD, supple, symmetrical, trachea midline and thyroid not enlarged, symmetric, no tenderness/mass/nodules Lymph nodes: Cervical, supraclavicular, and axillary nodes normal. Heart:regular rate and rhythm, S1, S2 normal, no murmur, click, rub or gallop Lung:chest clear, no wheezing, rales, normal symmetric air entry Abdomin: soft, non-tender, without masses or organomegaly EXT:no erythema, induration, or nodules   Lab Results: Lab Results  Component Value Date   WBC 6.8 08/06/2013   HGB 15.3 08/06/2013   HCT 43.2 08/06/2013   MCV 90.1 08/06/2013   PLT 149 08/06/2013     Chemistry  Component Value Date/Time   NA 130* 08/06/2013 1218   NA 135 02/07/2012 1628   K 4.0 08/06/2013 1218   K 4.1 02/07/2012 1628   CL 97* 08/07/2012 1417   CL 99 02/07/2012 1628    CO2 23 08/06/2013 1218   CO2 30 02/07/2012 1628   BUN 14.5 08/06/2013 1218   BUN 22 02/07/2012 1628   CREATININE 0.8 08/06/2013 1218   CREATININE 1.02 02/07/2012 1628      Component Value Date/Time   CALCIUM 8.9 08/06/2013 1218   CALCIUM 9.3 02/07/2012 1628   ALKPHOS 64 08/06/2013 1218   ALKPHOS 54 07/31/2011 1224   AST 39* 08/06/2013 1218   AST 28 07/31/2011 1224   ALT 42 08/06/2013 1218   ALT 35 07/31/2011 1224   BILITOT 0.78 08/06/2013 1218   BILITOT 0.7 07/31/2011 1224      Impression and Plan:  63 year old gentleman with the following issues:  1.Nonsecretory multiple myeloma in continuous remission now out over 16 years from diagnosis and 15 years off all chemotherapy. He has no evidence of any relapsed disease. He did not have any blood work done yet today so we will obtain at and if it's all clear we will repeat it in one year. I will continue to monitor his labs on an annual basis. I will repeat imaging studies as needed.  2. Followup: Will be in 12 months sooner if there is any abnormalities.  Joshua Portela, MD 4/24/20153:48 PM

## 2014-04-05 LAB — SPEP & IFE WITH QIG
ALBUMIN ELP: 64.4 % (ref 55.8–66.1)
Alpha-1-Globulin: 3.2 % (ref 2.9–4.9)
Alpha-2-Globulin: 7.5 % (ref 7.1–11.8)
BETA 2: 4.5 % (ref 3.2–6.5)
Beta Globulin: 5.6 % (ref 4.7–7.2)
Gamma Globulin: 14.8 % (ref 11.1–18.8)
IGM, SERUM: 114 mg/dL (ref 41–251)
IgA: 216 mg/dL (ref 68–379)
IgG (Immunoglobin G), Serum: 1000 mg/dL (ref 650–1600)
Total Protein, Serum Electrophoresis: 6.6 g/dL (ref 6.0–8.3)

## 2014-04-05 LAB — KAPPA/LAMBDA LIGHT CHAINS
KAPPA FREE LGHT CHN: 1.27 mg/dL (ref 0.33–1.94)
KAPPA LAMBDA RATIO: 0.94 (ref 0.26–1.65)
Lambda Free Lght Chn: 1.35 mg/dL (ref 0.57–2.63)

## 2014-07-10 ENCOUNTER — Encounter (HOSPITAL_COMMUNITY): Payer: Self-pay | Admitting: Emergency Medicine

## 2014-07-10 ENCOUNTER — Emergency Department (HOSPITAL_COMMUNITY)
Admission: EM | Admit: 2014-07-10 | Discharge: 2014-07-10 | Disposition: A | Discharge status: Home / Self Care | Payer: 59 | Attending: Emergency Medicine | Admitting: Emergency Medicine

## 2014-07-10 DIAGNOSIS — Z87898 Personal history of other specified conditions: Secondary | ICD-10-CM | POA: Insufficient documentation

## 2014-07-10 DIAGNOSIS — T498X5A Adverse effect of other topical agents, initial encounter: Secondary | ICD-10-CM | POA: Diagnosis not present

## 2014-07-10 DIAGNOSIS — Z79899 Other long term (current) drug therapy: Secondary | ICD-10-CM | POA: Insufficient documentation

## 2014-07-10 DIAGNOSIS — IMO0002 Reserved for concepts with insufficient information to code with codable children: Secondary | ICD-10-CM | POA: Insufficient documentation

## 2014-07-10 DIAGNOSIS — T782XXA Anaphylactic shock, unspecified, initial encounter: Secondary | ICD-10-CM

## 2014-07-10 DIAGNOSIS — I10 Essential (primary) hypertension: Secondary | ICD-10-CM | POA: Diagnosis not present

## 2014-07-10 DIAGNOSIS — Z880 Allergy status to penicillin: Secondary | ICD-10-CM | POA: Diagnosis not present

## 2014-07-10 MED ORDER — EPINEPHRINE 0.3 MG/0.3ML IJ SOAJ: 0.3000 mg | INTRAMUSCULAR | Status: AC | PRN

## 2014-07-10 MED ORDER — METHYLPREDNISOLONE SODIUM SUCC 125 MG IJ SOLR
125.0000 mg | Freq: Once | INTRAMUSCULAR | Status: AC
  Administered 2014-07-10: 125 mg via INTRAVENOUS
  Filled 2014-07-10: qty 2

## 2014-07-10 MED ORDER — DIPHENHYDRAMINE HCL 25 MG PO TABS: 50.0000 mg | ORAL_TABLET | Freq: Four times a day (QID) | ORAL | Status: AC

## 2014-07-10 MED ORDER — PREDNISONE 50 MG PO TABS: 50.0000 mg | ORAL_TABLET | Freq: Every day | ORAL | Status: AC

## 2014-07-10 NOTE — ED Notes (Signed)
Bed: IZ12 Expected date:  Expected time:  Means of arrival:  Comments: ems- bee stings. Allergic reaction

## 2014-07-10 NOTE — ED Notes (Signed)
PER EMS - pt reports stung by numerous wasps around 1800 today, pt reports taking 50mg  Benadryl PO without relief.  Skin red, hives, lips mildly swollen and purple initially per EMS. LSCTAB.  Pt reports throat feels tight.    0.3mg  epi IM 50mg  Benadryl IV 50mg  Zantac IV 569ml NS bolus

## 2014-07-10 NOTE — Discharge Instructions (Signed)
Return to the ED with any concerns including difficulty breathing, lip or tongue swelling, vomiting and not able to keep down liquids, decreased level of alertness/lethargy, or any other alarming symptoms

## 2014-07-10 NOTE — ED Provider Notes (Signed)
CSN: [id number]     Arrival date & time 07/10/14  1933 History   First MD Initiated Contact with Patient 07/10/14 1936     Chief Complaint  Patient presents with  . Allergic Reaction     (Consider location/radiation/quality/duration/timing/severity/associated sxs/prior Treatment) HPI Pt presents with c/o being stung by numerous wasps on the his bilateral legs.  He states he was doing yardwork and must have disturbed some bees under a garbage can.  Pt began to feel short of breath, lip and tongue swelling, had near syncope.  Pt was stung approx 600pm.  EMS called and administered epinephrine, benadryl and zantac at approx 645-7pm.  Upon arrival to the ED pt feels much improved.  He no longer feels short of breath, his lip swelling is improved.  There are no other associated systemic symptoms, there are no other alleviating or modifying factors.   Past Medical History  Diagnosis Date  . Multiple myeloma in remission 08/17/2012    Dx 12/97 presented with L4 cord compression Rx emergency laminectomy Rx protocol randomized to prolonged chemo with VCBP x 12 mos. No RT.  Marland Kitchen HTN (hypertension), benign 08/17/2012  . Blood transfusion without reported diagnosis    Past Surgical History  Procedure Laterality Date  . Colonoscopy    . Arm surgery      right- plate placed  . Mandible surgery      plate placed  . Back surgery      fusion between 3rd and 5th vertebrae   Family History  Problem Relation Age of Onset  . Colon cancer Neg Hx   . Esophageal cancer Neg Hx   . Rectal cancer Neg Hx   . Stomach cancer Neg Hx    History  Substance Use Topics  . Smoking status: Never Smoker   . Smokeless tobacco: Never Used  . Alcohol Use: Not on file     Comment: few beers on the weekends    Review of Systems ROS reviewed and all otherwise negative except for mentioned in HPI    Allergies  Erythromycin and Penicillins  Home Medications   Prior to Admission medications   Medication Sig Start  Date End Date Taking? Authorizing Provider  amLODipine (NORVASC) 5 MG tablet Take 5 mg by mouth daily.   Yes Historical Provider, MD  fish oil-omega-3 fatty acids 1000 MG capsule Take 1 g by mouth daily.   Yes Historical Provider, MD  Multiple Vitamin (MULTIVITAMIN) tablet Take 1 tablet by mouth daily.   Yes Historical Provider, MD  testosterone (ANDROGEL) 50 MG/5GM GEL Place 5 g onto the skin daily.   Yes Historical Provider, MD  tetrahydrozoline (VISINE) 0.05 % ophthalmic solution Place 2 drops into both eyes daily as needed (dry eyes.).   Yes Historical Provider, MD  diphenhydrAMINE (BENADRYL) 25 MG tablet Take 2 tablets (50 mg total) by mouth every 6 (six) hours. Take 1-2 tablets every 6 hours x 2 days, then space out to an as needed basis 07/10/14   Threasa Beards, MD  EPINEPHrine (EPIPEN) 0.3 mg/0.3 mL IJ SOAJ injection Inject 0.3 mLs (0.3 mg total) into the muscle as needed. 07/10/14   Threasa Beards, MD  predniSONE (DELTASONE) 50 MG tablet Take 1 tablet (50 mg total) by mouth daily. 07/10/14   Threasa Beards, MD   BP 157/69  Pulse 79  Temp(Src) 97.7 F (36.5 C) (Oral)  Resp 17  SpO2 96% Vitals reviewed Physical Exam Physical Examination: General appearance - alert, well appearing, and  in no distress Mental status - alert, oriented to person, place, and time Eyes - no conjunctival injection, no scleral icterus Mouth - mucous membranes moist, pharynx normal without lesions Neck - supple, no significant adenopathy Chest - clear to auscultation, no wheezes, rales or rhonchi, symmetric air entry Heart - normal rate, regular rhythm, normal S1, S2, no murmurs, rubs, clicks or gallops Extremities - peripheral pulses normal, no pedal edema, no clubbing or cyanosis Skin - normal coloration and turgor, no rashes  ED Course  Procedures (including critical care time)  CRITICAL CARE Performed by: Threasa Beards Total critical care time: 35 Critical care time was exclusive of separately  billable procedures and treating other patients. Critical care was necessary to treat or prevent imminent or life-threatening deterioration. Critical care was time spent personally by me on the following activities: development of treatment plan with patient and/or surrogate as well as nursing, discussions with consultants, evaluation of patient's response to treatment, examination of patient, obtaining history from patient or surrogate, ordering and performing treatments and interventions, ordering and review of laboratory studies, ordering and review of radiographic studies, pulse oximetry and re-evaluation of patient's condition. Labs Review Labs Reviewed - No data to display  Imaging Review No results found.   EKG Interpretation None      MDM   Final diagnoses:  Anaphylaxis, initial encounter    Pt presenting with c/o allergic reaction after bee stings this evening.  He was treated with epinephrine by EMS which helped to resolve his symptoms.  He was also given benadryl, solumedrol and zantac as well.  Pt observed for 4 hours after epinephrine given.  He continues to have resolution of symptoms.  Plan for discharge with epipen, benadryl x 2-3 days and prednisone.   Discharged with strict return precautions.  Pt agreeable with plan.    Threasa Beards, MD 07/10/14 [phone]

## 2014-09-14 ENCOUNTER — Encounter: Payer: Self-pay | Admitting: Gastroenterology

## 2015-02-15 ENCOUNTER — Telehealth: Payer: Self-pay | Admitting: Oncology

## 2015-02-15 NOTE — Telephone Encounter (Signed)
Lft msg for pt confirming labs/ov r/s per provider schedule, mailed schedule to pt ..... KJ  °

## 2015-04-07 ENCOUNTER — Other Ambulatory Visit: Payer: 59

## 2015-04-07 ENCOUNTER — Ambulatory Visit: Payer: 59 | Admitting: Oncology

## 2015-04-10 ENCOUNTER — Other Ambulatory Visit: Payer: Self-pay | Admitting: *Deleted

## 2015-04-10 DIAGNOSIS — C9001 Multiple myeloma in remission: Secondary | ICD-10-CM

## 2015-04-11 ENCOUNTER — Other Ambulatory Visit: Payer: Self-pay

## 2015-04-11 ENCOUNTER — Ambulatory Visit: Payer: Self-pay | Admitting: Oncology

## 2018-01-09 ENCOUNTER — Encounter: Payer: Self-pay | Admitting: Gastroenterology

## 2023-03-14 ENCOUNTER — Encounter: Payer: Self-pay | Admitting: Gastroenterology
